# Patient Record
Sex: Female | Born: 1970
Health system: Southern US, Community
[De-identification: ages and names within clinical notes are randomized; demographics above are authoritative.]

---

## 2001-05-18 ENCOUNTER — Other Ambulatory Visit: Admission: RE | Admit: 2001-05-18 | Discharge: 2001-05-18 | Payer: Self-pay | Admitting: Obstetrics and Gynecology

## 2002-06-05 ENCOUNTER — Other Ambulatory Visit: Admission: RE | Admit: 2002-06-05 | Discharge: 2002-06-05 | Payer: Self-pay | Admitting: Obstetrics and Gynecology

## 2002-10-23 ENCOUNTER — Ambulatory Visit (HOSPITAL_COMMUNITY): Admission: RE | Admit: 2002-10-23 | Discharge: 2002-10-23 | Payer: Self-pay | Admitting: Gastroenterology

## 2003-01-14 ENCOUNTER — Encounter: Payer: Self-pay | Admitting: Obstetrics and Gynecology

## 2003-01-14 ENCOUNTER — Ambulatory Visit (HOSPITAL_COMMUNITY): Admission: RE | Admit: 2003-01-14 | Discharge: 2003-01-14 | Payer: Self-pay | Admitting: Obstetrics and Gynecology

## 2003-05-14 ENCOUNTER — Other Ambulatory Visit: Admission: RE | Admit: 2003-05-14 | Discharge: 2003-05-14 | Payer: Self-pay | Admitting: Obstetrics and Gynecology

## 2003-11-30 ENCOUNTER — Inpatient Hospital Stay (HOSPITAL_COMMUNITY): Admission: RE | Admit: 2003-11-30 | Discharge: 2003-11-30 | Payer: Self-pay | Admitting: Obstetrics and Gynecology

## 2004-08-17 ENCOUNTER — Other Ambulatory Visit: Admission: RE | Admit: 2004-08-17 | Discharge: 2004-08-17 | Payer: Self-pay | Admitting: Obstetrics and Gynecology

## 2004-08-18 ENCOUNTER — Ambulatory Visit (HOSPITAL_COMMUNITY): Admission: RE | Admit: 2004-08-18 | Discharge: 2004-08-18 | Payer: Self-pay | Admitting: Obstetrics and Gynecology

## 2004-09-07 ENCOUNTER — Ambulatory Visit (HOSPITAL_COMMUNITY): Admission: RE | Admit: 2004-09-07 | Discharge: 2004-09-07 | Payer: Self-pay | Admitting: Obstetrics and Gynecology

## 2005-02-02 ENCOUNTER — Ambulatory Visit (HOSPITAL_COMMUNITY): Admission: RE | Admit: 2005-02-02 | Discharge: 2005-02-02 | Payer: Self-pay | Admitting: Obstetrics and Gynecology

## 2005-09-18 ENCOUNTER — Inpatient Hospital Stay (HOSPITAL_COMMUNITY): Admission: AD | Admit: 2005-09-18 | Discharge: 2005-09-21 | Payer: Self-pay | Admitting: Obstetrics and Gynecology

## 2005-10-16 IMAGING — US US PELVIS COMPLETE MODIFY
1 series · 13 of 25 positions shown · non-contrast
Comparison: none

CLINICAL DATA: Endometriosis. Fibroids.

[Series 1: us pelvis complete modify · 0.36mm/px · 13 of 59 slices shown]
[im 1/59]
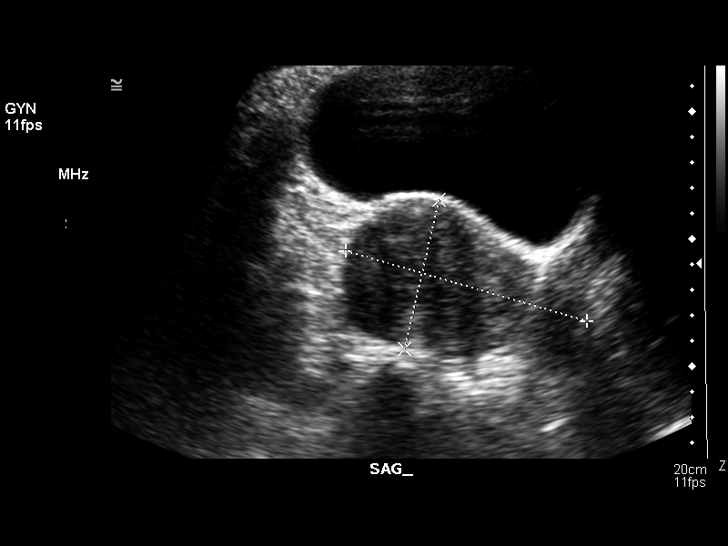
[im 5/59]
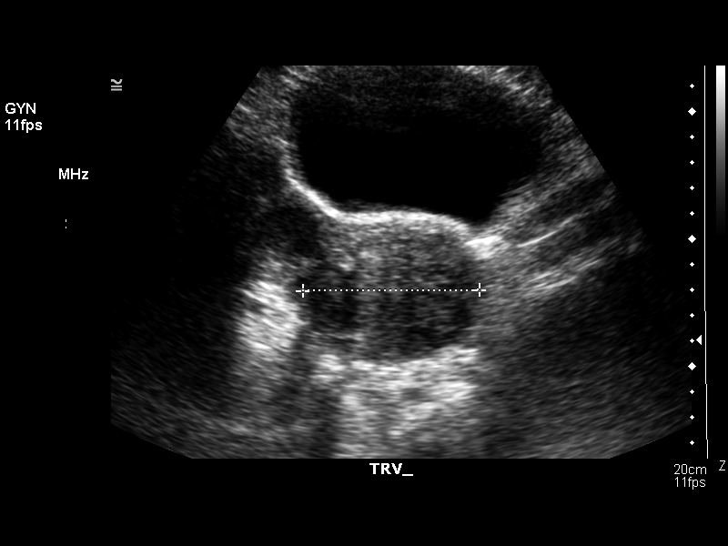
[im 10/59]
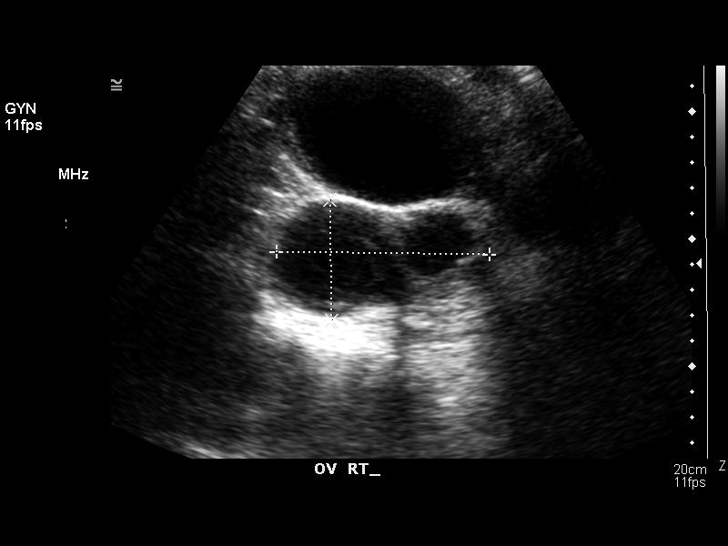
[im 15/59]
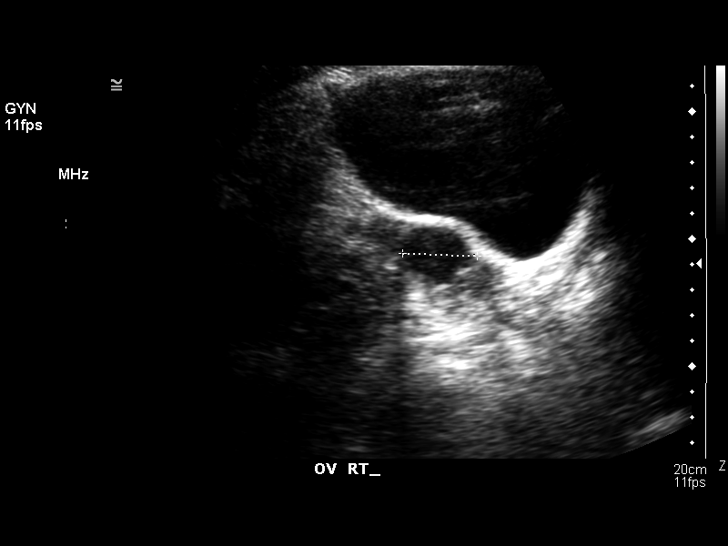
[im 20/59]
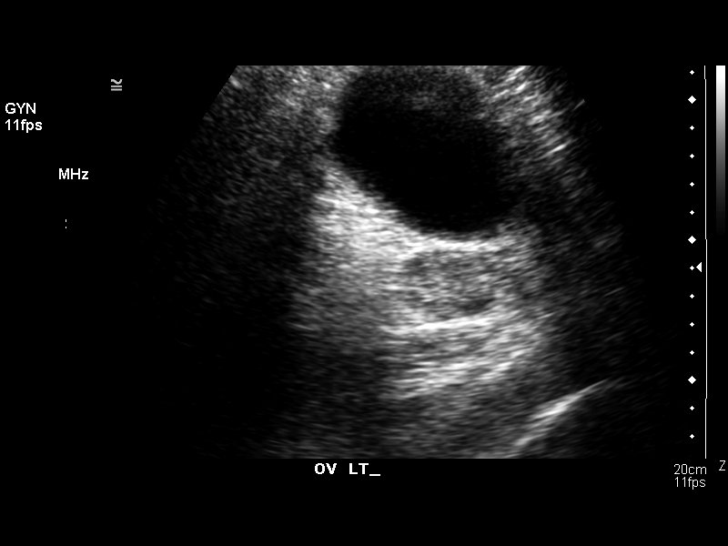
[im 25/59]
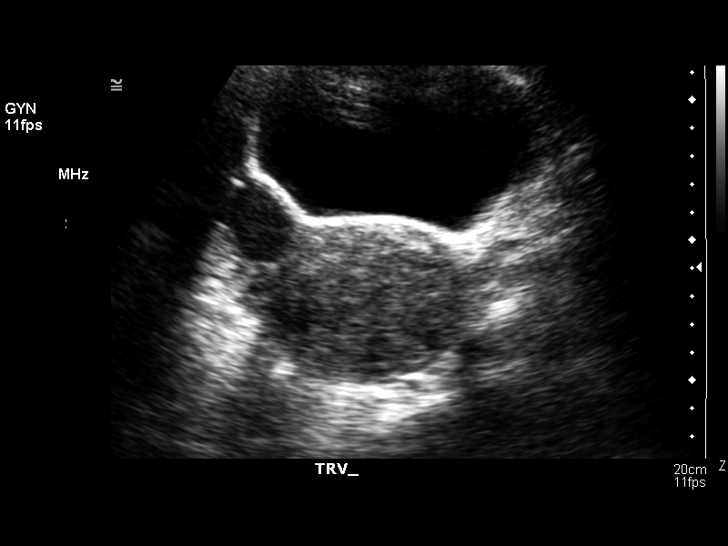
[im 30/59]
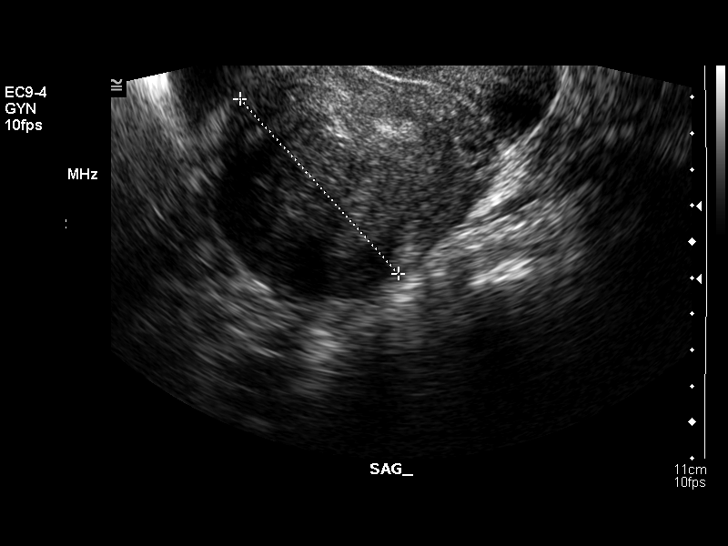
[im 34/59]
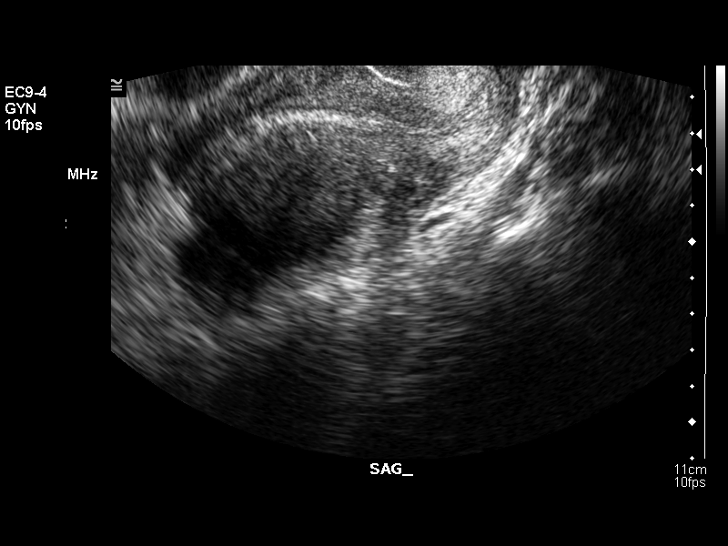
[im 39/59]
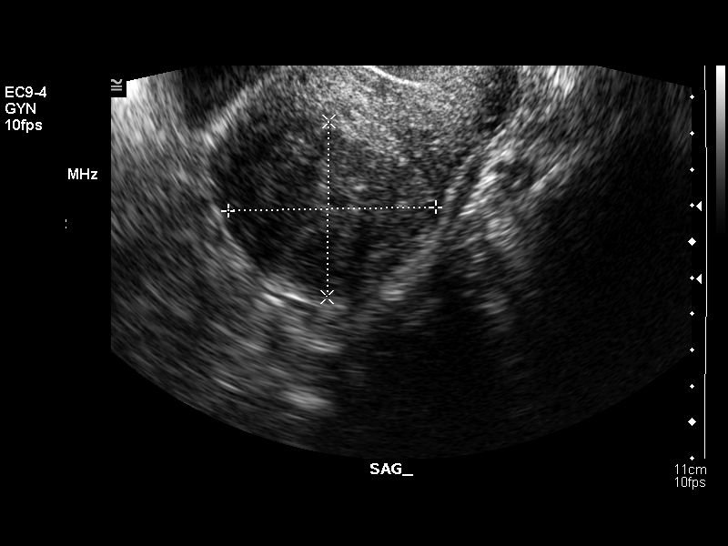
[im 44/59]
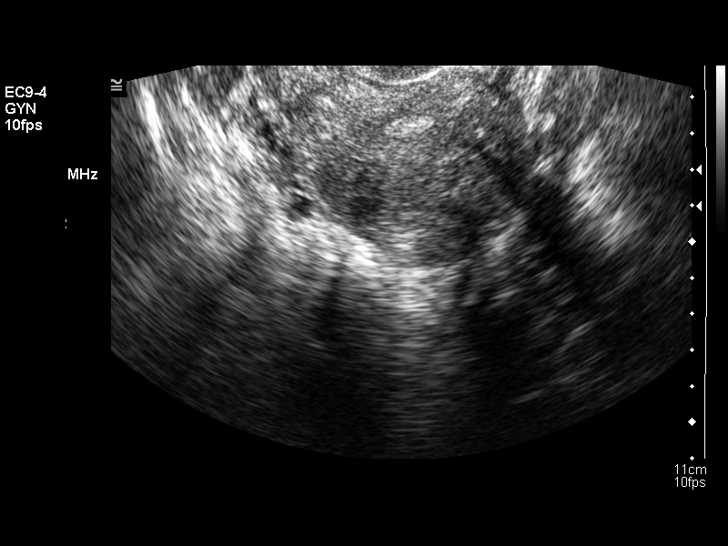
[im 49/59]
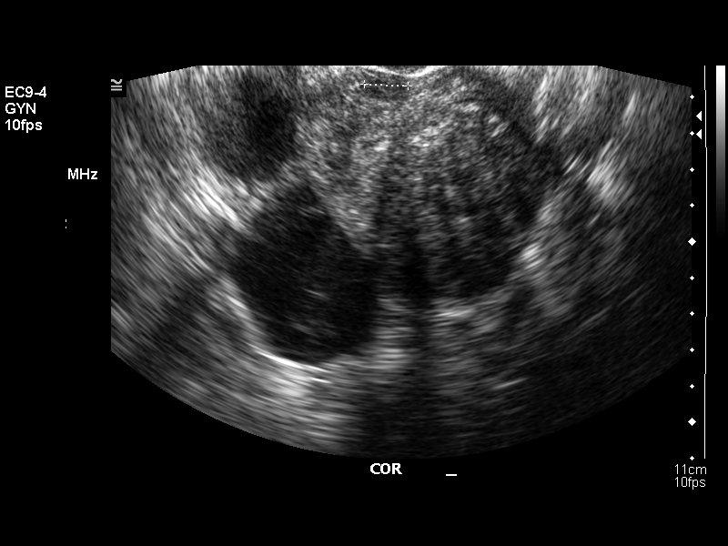
[im 54/59]
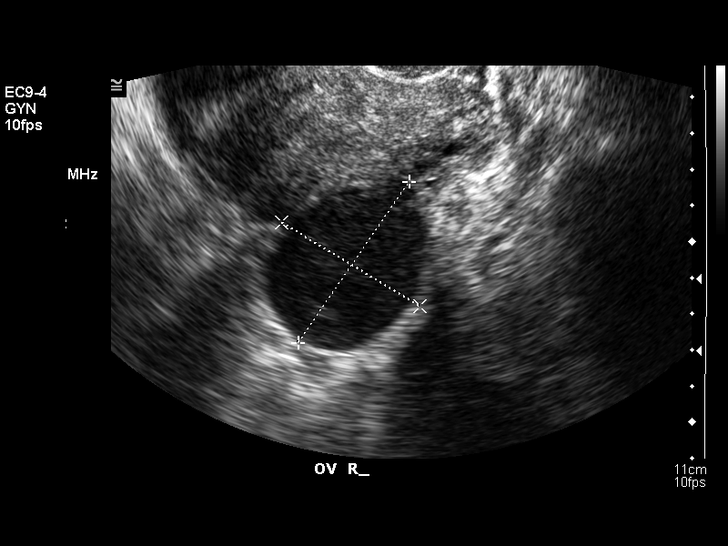
[im 59/59]
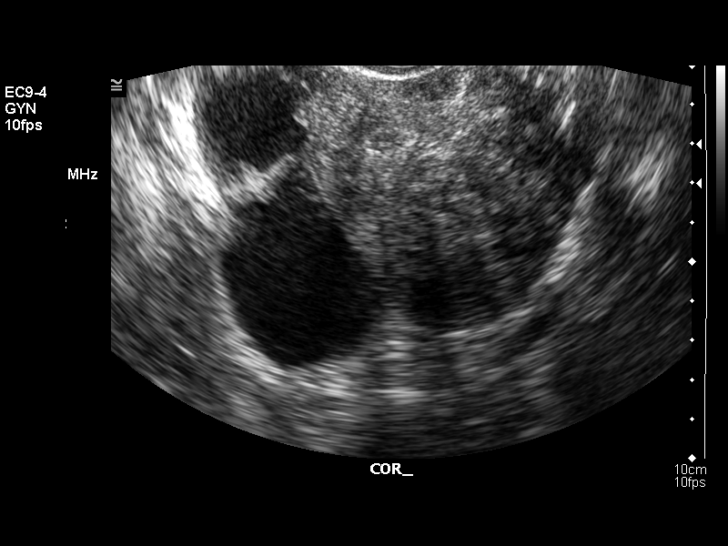

[13 of 25 positions shown; findings below may reference images not displayed]

TRANSABDOMINAL AND TRANSVAGINAL PELVIC ULTRASOUND:

The uterus is enlarged, measuring approximately 10.7 x 6.5 x 6.4 cm on
transabdominal sonography. At least five discrete fibroids are seen, largest of
which is located in the right posterior uterine body and measures 6.1 x 4.2 x
5.8 cm. This has a partial submucosal component which displaces the endometrial
cavity anteriorly. The other fibroids range in size from 1 to 2 cm. The
maximum double-layer endometrial thickness obtained on transvaginal sonography
is 8 to 9 mm. The endometrial stripe is normal in appearance.

No normal appearing right ovary is seen. Two large cystic lesions are seen in
the right adnexa, both of which have low-level internal echoes and increase
through transmission, which are sonographic features of endometriomas. These
measure 3.6 x 2.6 x 2.8 cm and 5.1 x 3.9 x 4.1 cm in size.

The left ovary is seen only on transabdominal sonography and is normal in
appearance. No left sided adnexal masses are seen and there is no evidence of
free fluid.
IMPRESSION: 1. Multiple uterine fibroids, with largest fibroid in the left posterior
uterine body measuring approximately 6 cm in diameter. This largest fibroid has
a partial submucosal component displacing the endometrium anteriorly. 

2. Two cystic lesions within the right adnexa, largest measuring 5 cm, which
have sonographic features consistent with endometriomas. If these are not
removed surgically, follow-up ultrasound is recommended to confirm stability
over time. 

3. Normal appearance of the left ovary.

## 2005-11-01 ENCOUNTER — Other Ambulatory Visit: Admission: RE | Admit: 2005-11-01 | Discharge: 2005-11-01 | Payer: Self-pay | Admitting: Obstetrics and Gynecology

## 2005-11-05 IMAGING — RF DG HYSTEROGRAM
10 series · 10 of 10 positions shown · IV contrast (omnipaque)
Comparison: 01/14/03.

CLINICAL DATA: Infertility.  Endometriosis.  Fibroids.
 HYSTEROSALPINGOGRAM:
 Following cleansing of the cervix and vagina with Betadine solution, a hysterosalpingogram was performed using a 5 French hysterosalpingogram catheter and Omnipaque 300 contrast.  The patient tolerated the examination without difficultly.

[Series 1: run · 1 of 1 slices shown (1 of 10)]
[im 1/1]
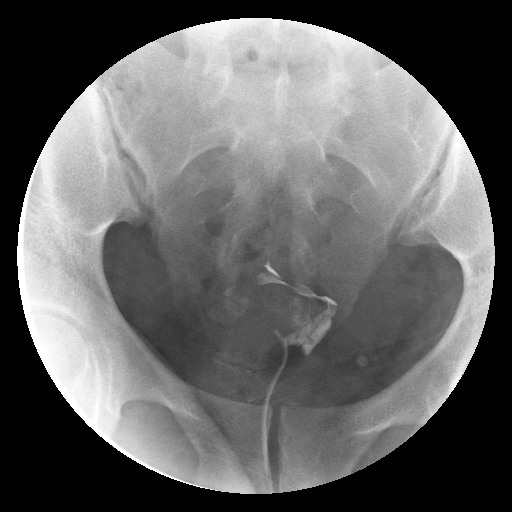

[Series 2: run · 1 of 1 slices shown (2 of 10)]
[im 1/1]
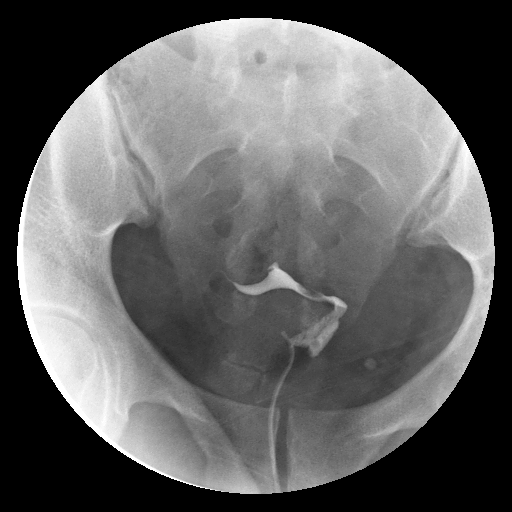

[Series 3: run · 1 of 1 slices shown (3 of 10)]
[im 1/1]
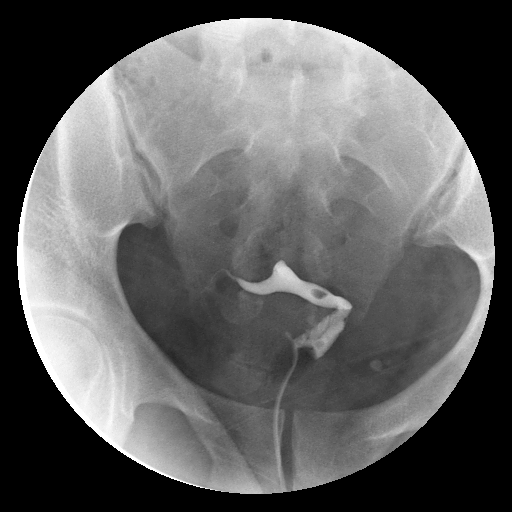

[Series 4: run · 1 of 1 slices shown (4 of 10)]
[im 1/1]
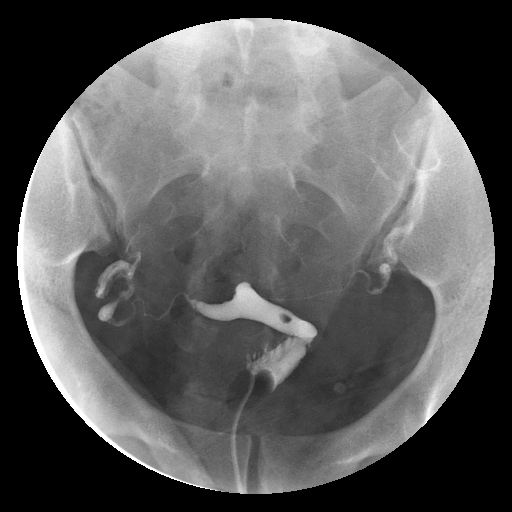

[Series 5: run · 1 of 1 slices shown (5 of 10)]
[im 1/1]
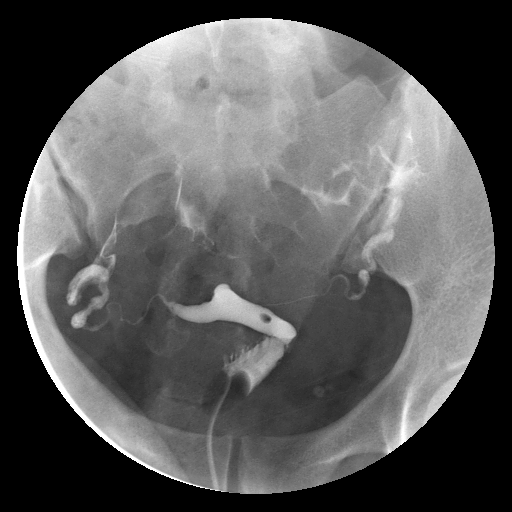

[Series 6: run · 1 of 1 slices shown (6 of 10)]
[im 1/1]
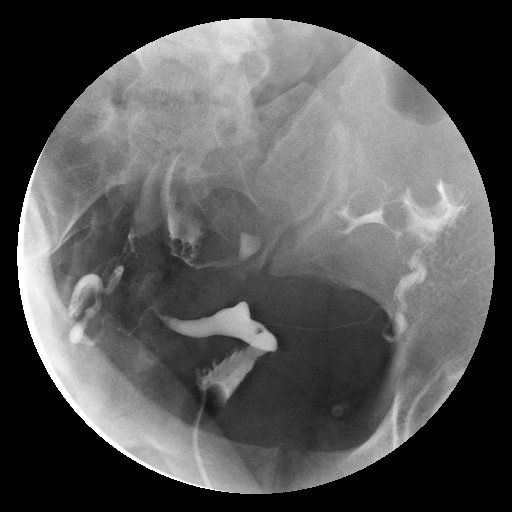

[Series 7: run · 1 of 1 slices shown (7 of 10)]
[im 1/1]
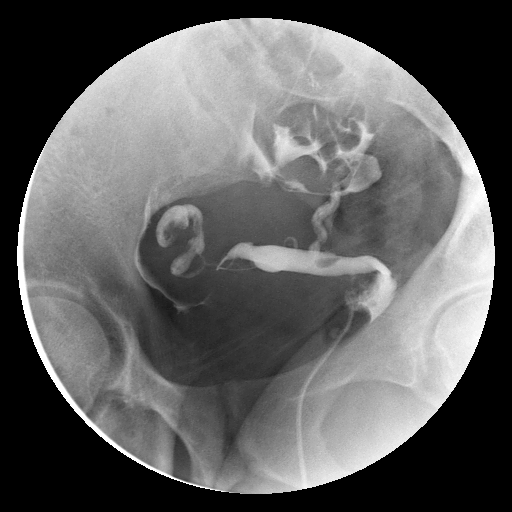

[Series 8: run · 1 of 1 slices shown (8 of 10)]
[im 1/1]
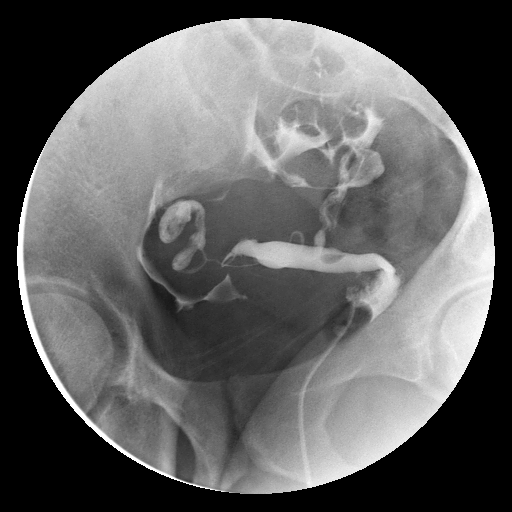

[Series 9: run · 1 of 1 slices shown (9 of 10)]
[im 1/1]
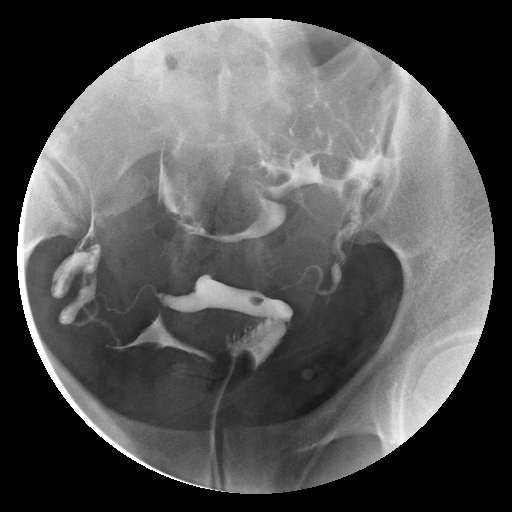

[Series 10: run · 1 of 1 slices shown (10 of 10)]
[im 1/1]
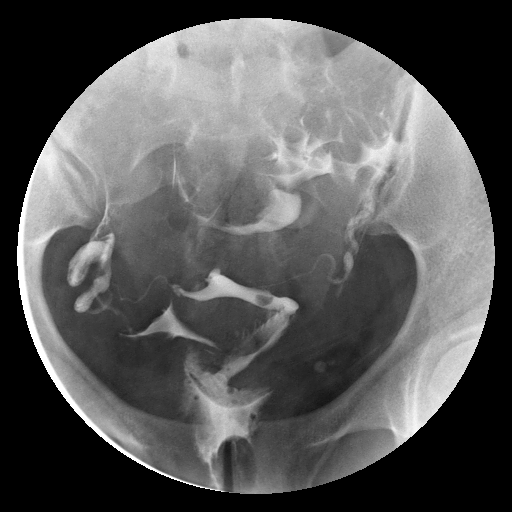

[10 of 10 positions shown; findings below may reference images not displayed]

The uterus remains anteflexed with the endometrial cavity tilted to the right.  This is likely due to the dominant left sided fibroid seen on previous pelvic ultrasound.  No definite submucosal fibroid is seen.  There is a persistent polypoid filling defect in the lower uterine body which is unchanged compared with the prior exam and is suspicious for a small endometrial polyp.  
 Opacification of both fallopian tubes is seen.  Both fallopian tubes are normal in appearance and intraperitoneal spill of contrast from both tubes is demonstrated.
IMPRESSION: Endometrial cavity of the uterus is tilted to the right, likely due to the dominant left sided fibroid seen on prior pelvic ultrasound.  No submucosal fibroids identified.  Small filling defect in the lower portion of the endometrial cavity is unchanged and consistent with an endometrial polyp.  
 Both fallopian tubes are patent.

## 2006-04-02 IMAGING — US US OB TRANSVAGINAL MODIFY
1 series · 17 of 28 positions shown · non-contrast
Comparison: none

CLINICAL DATA: 7 week 0 day gestational age by LMP. Known fibroids and endometriosis. Evaluate fetal life and confirm dating.
OBSTETRICAL ULTRASOUND WITH TRANSVAGINAL:
Comparison is made with prior pelvic ultrasound performed on 08/18/04.
A single living intrauterine gestation is seen with measured heart rate of 120.  Embryonic crown rump length measures 9 mm, corresponding with a gestational age of 7 weeks 0 days.  A normal appearing yolk sac is seen.  There is no evidence of subchorionic hemorrhage.
At least four separate uterine fibroids are seen.  These range in size from 2 cm to the largest in the posterior uterine fundus measuring 8.2 x 6.2 x 8.9 cm.  This largest fibroid is mildly increased in size compared with prior study when it measured 6 cm in greatest diameter.  Again seen are two cystic lesions in the right adnexa which have low level internal echoes and increase through transmission and are located adjacent to the right ovary.  The largest measures approximately 4.5 cm in diameter.  These are unchanged in size and appearance, and sonographic features are consistent with endometriomas.  
The left ovary is not directly visualized on transabdominal or transvaginal sonography, however no left adnexal masses are seen.

[Series 1: us ob comp less 14 wks · 17 of 51 slices shown]
[im 1/51]
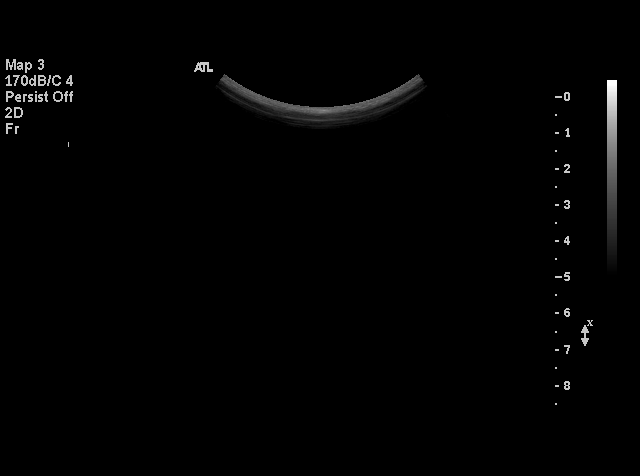
[im 4/51]
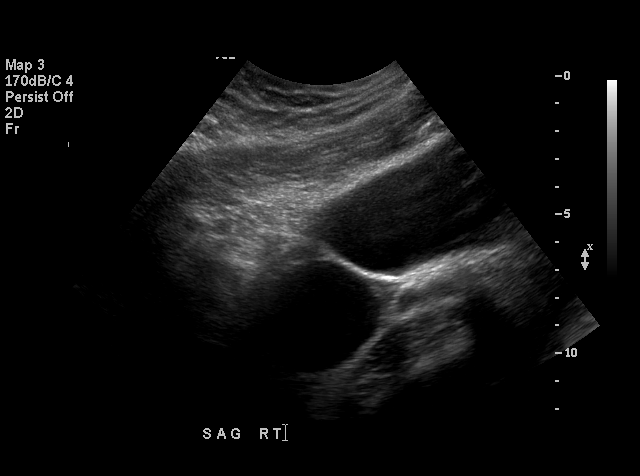
[im 8/51]
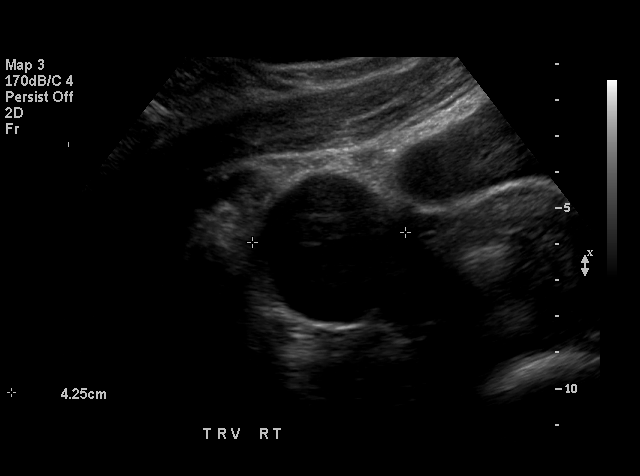
[im 10/51]
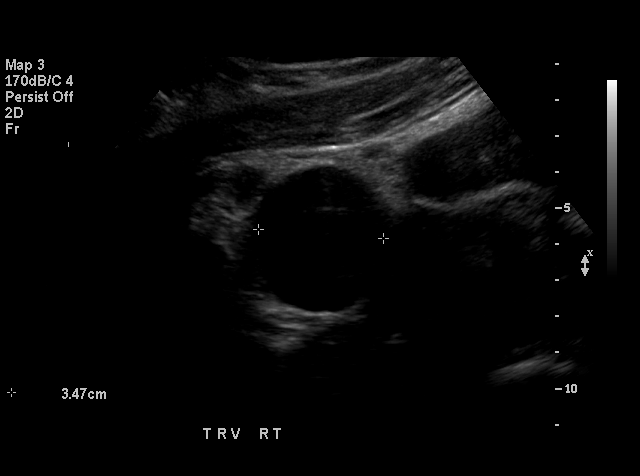
[im 13/51]
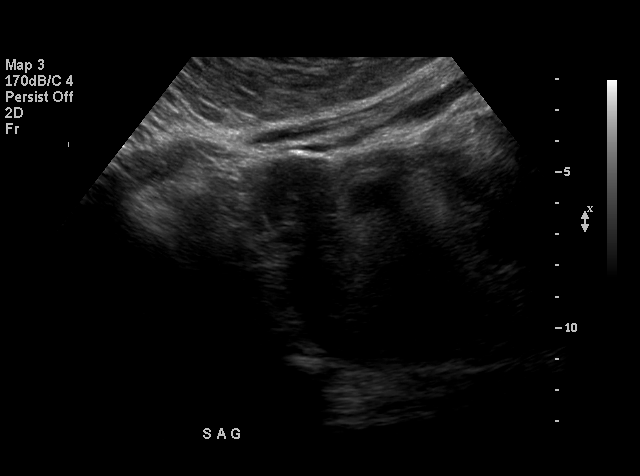
[im 17/51]
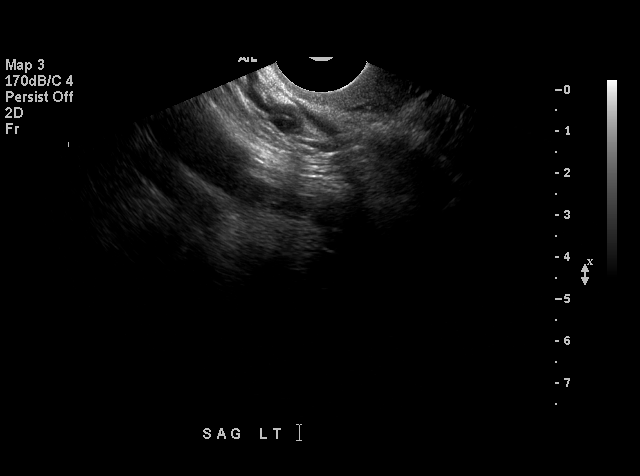
[im 19/51]
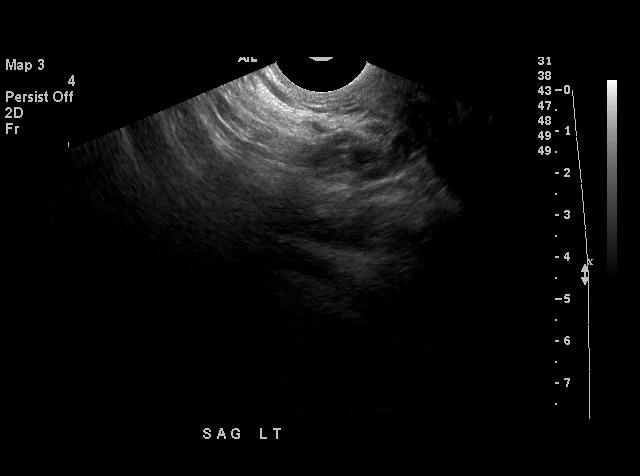
[im 23/51]
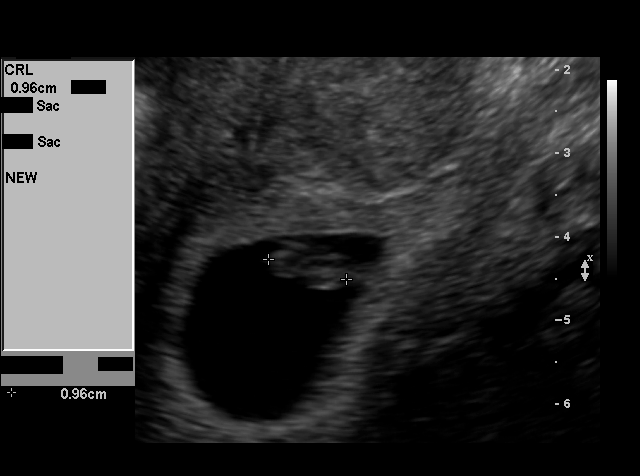
[im 26/51]
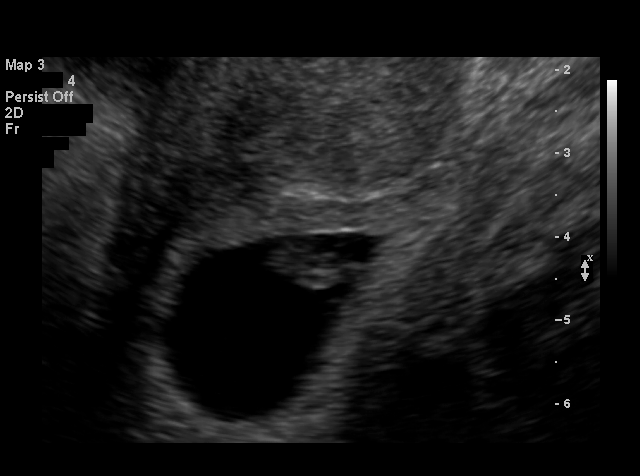
[im 28/51]
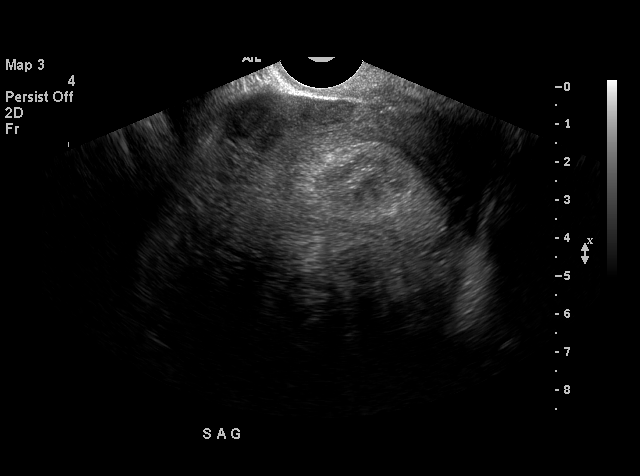
[im 32/51]
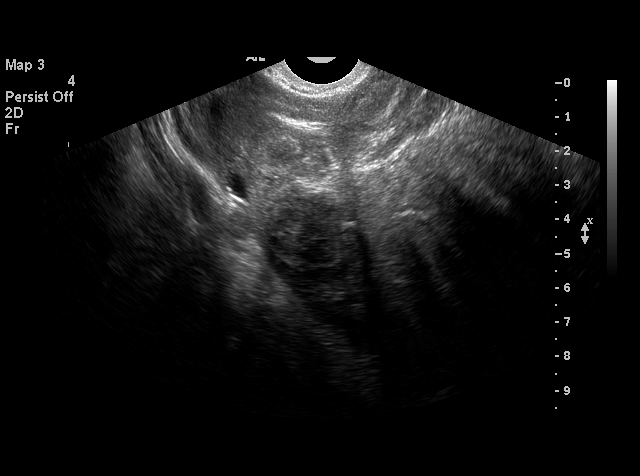
[im 34/51]
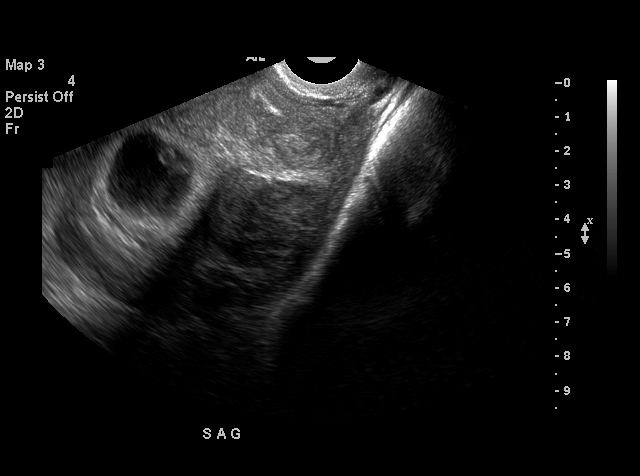
[im 38/51]
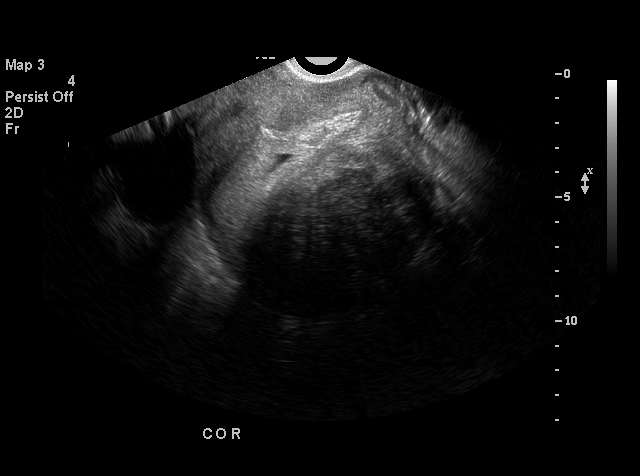
[im 41/51]
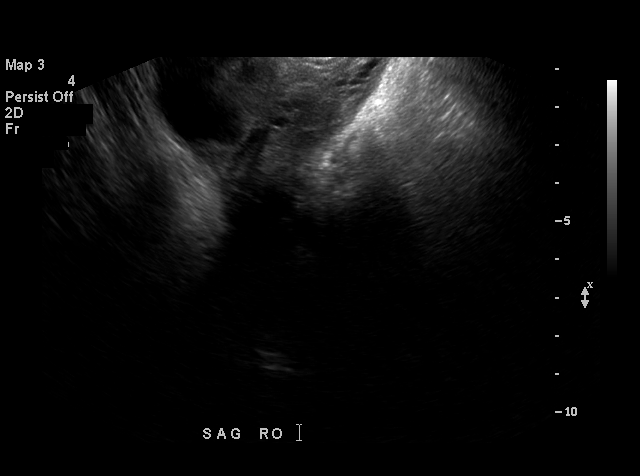
[im 43/51]
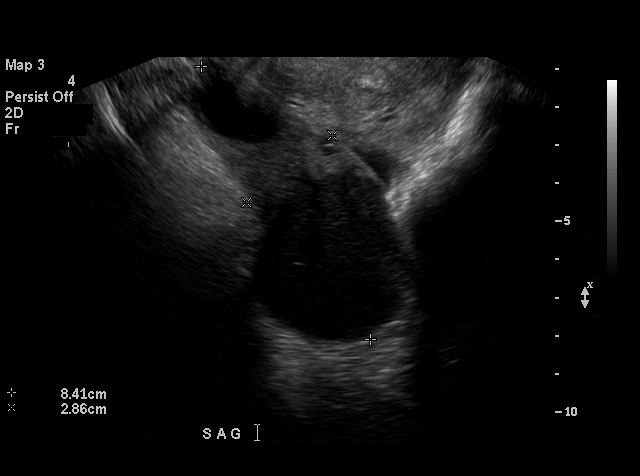
[im 47/51]
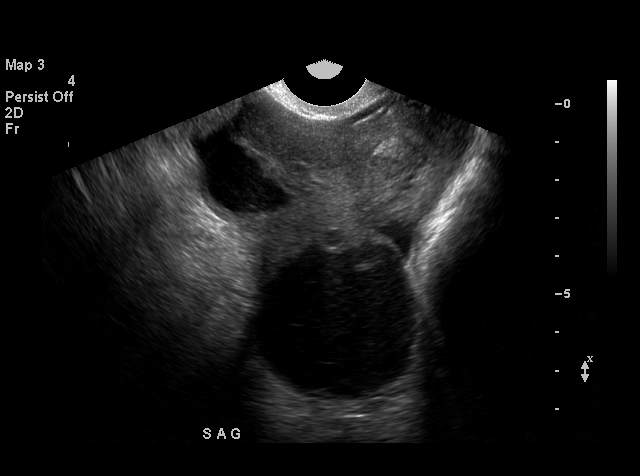
[im 51/51]
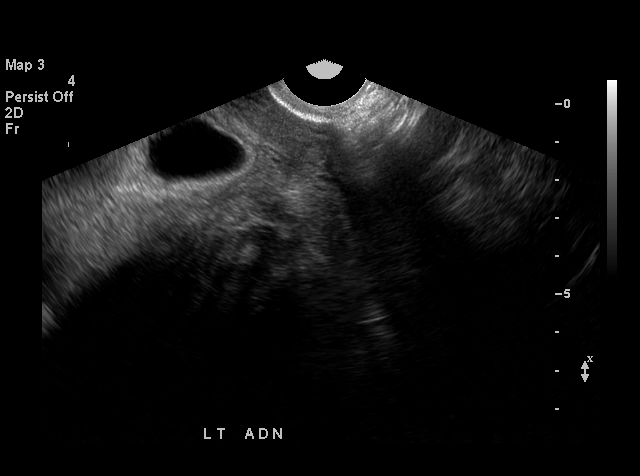

[17 of 28 positions shown; findings below may reference images not displayed]

IMPRESSION: 1.  Single living intrauterine gestation with estimated gestational age of 7 weeks 0 days and sonographic EDC of 09/21/05.  This matches LMP dating.  
2.  At least four uterine fibroids, largest measuring 8.9 cm which has increased in size from approximately 6 cm on prior study of 08/18/04.
3.  Two complex cystic lesions in the right adnexa adjacent to the right ovary, largest measuring 5 cm.  These remain stable, with sonographic features consistent with endometriomas.

## 2010-11-01 ENCOUNTER — Encounter: Payer: Self-pay | Admitting: Obstetrics and Gynecology

## 2014-08-21 ENCOUNTER — Other Ambulatory Visit: Payer: Self-pay | Admitting: Obstetrics and Gynecology

## 2014-08-21 DIAGNOSIS — Z1231 Encounter for screening mammogram for malignant neoplasm of breast: Secondary | ICD-10-CM

## 2014-09-19 ENCOUNTER — Ambulatory Visit
Admission: RE | Admit: 2014-09-19 | Discharge: 2014-09-19 | Disposition: A | Discharge status: Home / Self Care | Payer: BC Managed Care – PPO | Source: Ambulatory Visit | Attending: Obstetrics and Gynecology | Admitting: Obstetrics and Gynecology

## 2014-09-19 DIAGNOSIS — Z1231 Encounter for screening mammogram for malignant neoplasm of breast: Secondary | ICD-10-CM

## 2018-08-25 DIAGNOSIS — R194 Change in bowel habit: Secondary | ICD-10-CM | POA: Diagnosis not present

## 2018-08-25 DIAGNOSIS — R208 Other disturbances of skin sensation: Secondary | ICD-10-CM | POA: Diagnosis not present

## 2018-08-25 DIAGNOSIS — G4733 Obstructive sleep apnea (adult) (pediatric): Secondary | ICD-10-CM | POA: Diagnosis not present

## 2018-09-14 ENCOUNTER — Other Ambulatory Visit: Payer: Self-pay | Admitting: Gastroenterology

## 2018-09-14 DIAGNOSIS — K6289 Other specified diseases of anus and rectum: Secondary | ICD-10-CM | POA: Diagnosis not present

## 2018-09-14 DIAGNOSIS — R194 Change in bowel habit: Secondary | ICD-10-CM | POA: Diagnosis not present

## 2018-10-16 NOTE — H&P (Signed)
History of Present Illness  General:  47/female presents with change in bowel habits. Recent labs from 08/25/18 were normal for CBC, CMP showed Cr of 1.08, otherwise normal. Since 3/19, she has change in bowel habits, prior to that she needed to drink a lot of water to avoid constipation.After having her son, she had BMs she may not have a Bm for days but did not need a laxative. For the last 10 months, she has developed change in Bms and needs to use the bathroom after almost every meal, stools were soft to loose, non bloody, around1-3/day. Since 9/19, she developed pain in the rectal area, she felt a pulsation in rectal area, she thought it was getting warm, felt like a rubber and folding on itself when she would sit. She had rectal pain on sitting down and during a Bm and thought her rectum felt "hot'. Denies nocturnal diarrhea, unintentional weight loss, denies blood in stool or black stools, denies abdominal pain/nausea/vomiting/acid reflux/difficulty or pain on swallowing. There is no family history of colon cancer and no prior colonoscopy. She had a DRE by Kateri Mc and it was reported normal and small internal hemorrhoids. She has been using prep H suppository and uses a round donut while sitting.   Current Medications  None   Past Medical History  GYN exam [facility] 11/15 .   Pelvic Pain.   Irregular periods.   Anemia.   Vitamin D def.   Right ovarian cyst.    Surgical History  Appendectomy   C-section 2006  right ovarian cyst-laparoscopy 02/2001  Wisdom Teeth extraction    Family History  Father: deceased  Mother: alive, diagnosed with Diabetes  No Family History of Colon Cancer, Polyps, or Liver Disease.   Social History  General:  no Alcohol.  Caffeine: yes.  DIET: no.  Tobacco use  cigarettes: Never smoked Tobacco history last updated 09/14/2018 no Recreational drug use.  OCCUPATION: programmer.  no Exercise.    Allergies  anasthesia :  nausea/vomiing   Hospitalization/Major Diagnostic Procedure  not in past year 09/2018   Review of Systems  CONSTITUTIONAL:  Chills No. Fatigue No. Fever No. Insomnia No. Night sweats No. Weight change No.  HEENT:  Change in vision No. Double vision No . Hoarseness No. Nose bleeds No. sore throat No. Sinus Problems No. Glaucoma No.  CARDIOLOGY:  ByPass Surgery No. Poor Circulation No. Artificial Heart Valves No. High blood pressure No. History of Heart attack No. Irregular heart beat No. Known coronary artery disease No. Pacemaker/Defibrillator No.  RESPIRATORY:  Shortness of breath No. Cough No. Excessive Sputum No. Using Oxygen No. Asthma No. Bronchitis No. Pneumonia No. Sleep Apnea YES.  UROLOGY:  Interstitials Cystitis No. Incontinence No. Blood in urine No. Difficulty urinating No. Kidney disease No. Kidney stones No.  GASTROENTEROLOGY:  Abdominal pain No. Acid reflux No. Black stools No. Bloating/belching No. Change in bowel habits YES. Constipation No. Dark tarry stools No. Diarrhea No. Difficulty swallowing No. Heartburn No. Incontinence of stool No. Indigestion: No. Lactose intolerance No. Nausea No. Rectal bleeding No. Vomiting No. Hepatitis/yellow jaundice No. History of Ulcers No. Use of Pain Medication No. Previous Colonoscopy No.  MUSCULOSKELETAL:  joint pain No. Arthritis No. Joint Replacement No.  NEUROLOGY:  Dizziness No. Fainting No. Headache No. Paralysis No. Seizures No. Strokes No. Weakness No. Alzheimer's No.  SKIN:  Rash No. Bruises easily No.  ENDOCRINOLOGY:  Diabetes No. High cholesterol No. Thyroid disorder No.  HEMATOLOGY/LYMPH:  Abnormal bleeding No. Anemia No. Enlarged lymph  nodes No. Past blood transfusion No. Swollen glands No. Blood Clots No. Using Blood Thinners No.  INFECTIOUS DISEASE:  HIV/AIDS No. Tuberculosis No . Hepatitis No. Sexually Transmitted Disease No. MRSA No.  GI PROCEDURE:  no Pacemaker/ AICD, no. no Artificial heart valves. no MI/heart  attack. no Abnormal heart rhythm. no Angina. no CVA. no Hypertension. no Hypotension. no Asthma, COPD. no Sleep apnea. no Seizure disorders. no Artificial joints. no Severe DJD. no Diabetes. no Significant headaches. Vertigo YES. no Depression/anxiety. no Abnormal bleeding. no Kidney Disease. no Liver disease, no. no Chance of pregnancy. no Blood transfusion. no Method of Birth Control. no Birth control pills.  GU/GYN:  Pregnant Now No. Trying to conceive No. Use Birth Control No. Heavy Periods No.      Vital Signs  Wt 327, Wt change 1 lb, Ht 65, BMI 54.41, Temp 98.4, Pulse sitting 83, BP sitting 129/76.   Examination  Gastroenterology:: GENERAL APPEARANCE: Well developed, obese, no active distress, pleasant.  EYES: Lids and conjunctiva normal. Sclera normal.  ORAL CAVITY: Lips, teeth and gums are normal. Pharynx, tongue, mucosa normal .  SCLERA: anicteric .  CARDIOVASCULAR RRR no murmur, Normal RRR w/o murmers or gallops. No peripheral edema .  RESPIRATORY Breath sounds normal. Respiration even and unlabored .  ABDOMEN No masses palpated. Liver and spleen not palpated, normal. Bowel sounds normal, Abdomen not distended .  EXTREMITIES: No edema, pulses intact .  NEURO: normal strength, normal gait .  PSYCH: mood/affect normal .     Assessments   1. Change in bowel habits - R19.4 (Primary)   2. Rectal pain - K62.89   Treatment  1. Change in bowel habits  IMAGING: Colonoscopy Notes: DDX: microscopic colitis, IBS, diverticulosis. The risks and benefits were discussed with the patient in details. She understands and verbalizes consent. Will obtain biopsies from random areas of the colon.  Referral To: Reason:colon-propofol-spoke with [facility]-#[id number]    2. Rectal pain  IMAGING: Colonoscopy Notes: DRE by PCP was unremarkable. Advised to avoid straining, sitz bath at home, continue prep H suppositories.

## 2018-10-17 ENCOUNTER — Ambulatory Visit (HOSPITAL_COMMUNITY): Payer: 59 | Admitting: Anesthesiology

## 2018-10-17 ENCOUNTER — Ambulatory Visit (HOSPITAL_COMMUNITY)
Admission: RE | Admit: 2018-10-17 | Discharge: 2018-10-17 | Disposition: A | Discharge status: Home / Self Care | Payer: 59 | Attending: Gastroenterology | Admitting: Gastroenterology

## 2018-10-17 ENCOUNTER — Encounter (HOSPITAL_COMMUNITY)
Admission: RE | Disposition: A | Discharge status: Home / Self Care | Payer: Self-pay | Source: Home / Self Care | Attending: Gastroenterology

## 2018-10-17 ENCOUNTER — Encounter (HOSPITAL_COMMUNITY): Payer: Self-pay | Admitting: Anesthesiology

## 2018-10-17 ENCOUNTER — Other Ambulatory Visit: Payer: Self-pay

## 2018-10-17 DIAGNOSIS — K6289 Other specified diseases of anus and rectum: Secondary | ICD-10-CM | POA: Insufficient documentation

## 2018-10-17 DIAGNOSIS — Z6841 Body Mass Index (BMI) 40.0 and over, adult: Secondary | ICD-10-CM | POA: Insufficient documentation

## 2018-10-17 DIAGNOSIS — G4730 Sleep apnea, unspecified: Secondary | ICD-10-CM | POA: Insufficient documentation

## 2018-10-17 DIAGNOSIS — D122 Benign neoplasm of ascending colon: Secondary | ICD-10-CM | POA: Insufficient documentation

## 2018-10-17 DIAGNOSIS — R194 Change in bowel habit: Secondary | ICD-10-CM | POA: Diagnosis not present

## 2018-10-17 HISTORY — PX: BIOPSY: SHX5522

## 2018-10-17 HISTORY — PX: COLONOSCOPY: SHX5424

## 2018-10-17 LAB — PREGNANCY, URINE: Preg Test, Ur: NEGATIVE

## 2018-10-17 SURGERY — COLONOSCOPY
Anesthesia: Monitor Anesthesia Care

## 2018-10-17 MED ORDER — ONDANSETRON HCL 4 MG/2ML IJ SOLN
INTRAMUSCULAR | Status: DC | PRN
  Administered 2018-10-17: 4 mg via INTRAVENOUS

## 2018-10-17 MED ORDER — SODIUM CHLORIDE 0.9 % IV SOLN: INTRAVENOUS | Status: DC

## 2018-10-17 MED ORDER — LACTATED RINGERS IV SOLN
INTRAVENOUS | Status: DC
  Administered 2018-10-17: 1000 mL via INTRAVENOUS

## 2018-10-17 MED ORDER — PHENYLEPHRINE HCL 10 MG/ML IJ SOLN
INTRAMUSCULAR | Status: DC | PRN
  Administered 2018-10-17: 80 ug via INTRAVENOUS

## 2018-10-17 MED ORDER — MIDAZOLAM HCL 5 MG/5ML IJ SOLN
INTRAMUSCULAR | Status: DC | PRN
  Administered 2018-10-17: 2 mg via INTRAVENOUS

## 2018-10-17 MED ORDER — PROPOFOL 500 MG/50ML IV EMUL
INTRAVENOUS | Status: DC | PRN
  Administered 2018-10-17: 125 ug/kg/min via INTRAVENOUS

## 2018-10-17 MED ORDER — PROPOFOL 10 MG/ML IV BOLUS
INTRAVENOUS | Status: AC
  Filled 2018-10-17: qty 40

## 2018-10-17 MED ORDER — SCOPOLAMINE 1 MG/3DAYS TD PT72
MEDICATED_PATCH | TRANSDERMAL | Status: DC | PRN
  Administered 2018-10-17: 1 via TRANSDERMAL

## 2018-10-17 MED ORDER — PROPOFOL 500 MG/50ML IV EMUL
INTRAVENOUS | Status: DC | PRN
  Administered 2018-10-17: 50 mg via INTRAVENOUS

## 2018-10-17 MED ORDER — SCOPOLAMINE 1 MG/3DAYS TD PT72: 1.0000 | MEDICATED_PATCH | Freq: Once | TRANSDERMAL | Status: DC

## 2018-10-17 MED ORDER — MIDAZOLAM HCL 2 MG/2ML IJ SOLN
INTRAMUSCULAR | Status: AC
  Filled 2018-10-17: qty 2

## 2018-10-17 MED ORDER — SCOPOLAMINE 1 MG/3DAYS TD PT72
MEDICATED_PATCH | TRANSDERMAL | Status: AC
  Filled 2018-10-17: qty 1

## 2018-10-17 MED ORDER — PROPOFOL 10 MG/ML IV BOLUS
INTRAVENOUS | Status: AC
  Filled 2018-10-17: qty 60

## 2018-10-17 NOTE — Transfer of Care (Signed)
Immediate Anesthesia Transfer of Care Note  Patient: Abigail Roberts  Procedure(s) Performed: Procedure(s): COLONOSCOPY (N/A) BIOPSY  Patient Location: PACU  Anesthesia Type:MAC  Level of Consciousness:  sedated, patient cooperative and responds to stimulation  Airway & Oxygen Therapy:Patient Spontanous Breathing and Patient connected to face mask oxgen  Post-op Assessment:  Report given to PACU RN and Post -op Vital signs reviewed and stable  Post vital signs:  Reviewed and stable  Last Vitals:  Vitals:   10/17/18 0758  BP: (!) 152/73  Pulse: 91  Resp: 13  Temp: 01.1 C    Complications: No apparent anesthesia complications

## 2018-10-17 NOTE — Discharge Instructions (Signed)
Colonoscopy, Adult, Care After °This sheet gives you information about how to care for yourself after your procedure. Your doctor may also give you more specific instructions. If you have problems or questions, call your doctor. °What can I expect after the procedure? °After the procedure, it is common to have: °· A small amount of blood in your poop for 24 hours. °· Some gas. °· Mild cramping or bloating in your belly. °Follow these instructions at home: °General instructions °· For the first 24 hours after the procedure: °? Do not drive or use machinery. °? Do not sign important documents. °? Do not drink alcohol. °? Do your daily activities more slowly than normal. °? Eat foods that are soft and easy to digest. °· Take over-the-counter or prescription medicines only as told by your doctor. °To help cramping and bloating: ° °· Try walking around. °· Put heat on your belly (abdomen) as told by your doctor. Use a heat source that your doctor recommends, such as a moist heat pack or a heating pad. °? Put a towel between your skin and the heat source. °? Leave the heat on for 20-30 minutes. °? Remove the heat if your skin turns bright red. This is especially important if you cannot feel pain, heat, or cold. You can get burned. °Eating and drinking ° °· Drink enough fluid to keep your pee (urine) clear or pale yellow. °· Return to your normal diet as told by your doctor. Avoid heavy or fried foods that are hard to digest. °· Avoid drinking alcohol for as long as told by your doctor. °Contact a doctor if: °· You have blood in your poop (stool) 2-3 days after the procedure. °Get help right away if: °· You have more than a small amount of blood in your poop. °· You see large clumps of tissue (blood clots) in your poop. °· Your belly is swollen. °· You feel sick to your stomach (nauseous). °· You throw up (vomit). °· You have a fever. °· You have belly pain that gets worse, and medicine does not help your  pain. °Summary °· After the procedure, it is common to have a small amount of blood in your poop. You may also have mild cramping and bloating in your belly. °· For the first 24 hours after the procedure, do not drive or use machinery, do not sign important documents, and do not drink alcohol. °· Get help right away if you have a lot of blood in your poop, feel sick to your stomach, have a fever, or have more belly pain. °This information is not intended to replace advice given to you by your health care provider. Make sure you discuss any questions you have with your health care provider. °Document Released: 10/30/2010 Document Revised: 07/28/2017 Document Reviewed: 06/21/2016 °Elsevier Interactive Patient Education © 2019 [employer] ° °

## 2018-10-17 NOTE — Anesthesia Postprocedure Evaluation (Signed)
Anesthesia Post Note  Patient: Abigail Roberts  Procedure(s) Performed: COLONOSCOPY (N/A ) BIOPSY     Patient location during evaluation: Endoscopy Anesthesia Type: MAC Level of consciousness: awake and alert, patient cooperative and oriented Pain management: pain level controlled Vital Signs Assessment: post-procedure vital signs reviewed and stable Respiratory status: spontaneous breathing, nonlabored ventilation and respiratory function stable Cardiovascular status: blood pressure returned to baseline and stable Postop Assessment: no apparent nausea or vomiting Anesthetic complications: no    Last Vitals:  Vitals:   10/17/18 0930 10/17/18 0940  BP: (!) 141/29 116/69  Pulse: 88 78  Resp: 16 17  Temp:    SpO2: 100% 96%    Last Pain:  Vitals:   10/17/18 0940  TempSrc:   PainSc: 0-No pain                 Nakai Pollio,E. Leba Tibbitts

## 2018-10-17 NOTE — Anesthesia Preprocedure Evaluation (Addendum)
Anesthesia Evaluation  Patient identified by MRN, date of birth, ID band Patient awake    Reviewed: Allergy & Precautions, NPO status , Patient's Chart, lab work & pertinent test results  History of Anesthesia Complications Negative for: history of anesthetic complications  Airway Mallampati: I  TM Distance: >3 FB Neck ROM: Full    Dental  (+) Dental Advisory Given   Pulmonary sleep apnea (no mask yet) ,    breath sounds clear to auscultation       Cardiovascular negative cardio ROS   Rhythm:Regular Rate:Normal     Neuro/Psych negative neurological ROS     GI/Hepatic negative GI ROS, Neg liver ROS,   Endo/Other  Morbid obesity  Renal/GU negative Renal ROS     Musculoskeletal   Abdominal (+) + obese,   Peds  Hematology negative hematology ROS (+)   Anesthesia Other Findings   Reproductive/Obstetrics Unsure of last LMP                            Anesthesia Physical Anesthesia Plan  ASA: III  Anesthesia Plan: MAC   Post-op Pain Management:    Induction:   PONV Risk Score and Plan: 2 and Treatment may vary due to age or medical condition  Airway Management Planned: Nasal Cannula and Natural Airway  Additional Equipment:   Intra-op Plan:   Post-operative Plan:   Informed Consent: I have reviewed the patients History and Physical, chart, labs and discussed the procedure including the risks, benefits and alternatives for the proposed anesthesia with the patient or authorized representative who has indicated his/her understanding and acceptance.   Dental advisory given  Plan Discussed with: CRNA and Surgeon  Anesthesia Plan Comments: (Plan routine monitors, MAC)       Anesthesia Quick Evaluation

## 2018-10-17 NOTE — Brief Op Note (Signed)
10/17/2018  9:26 AM  PATIENT:  Abigail Roberts  48 y.o. female  PRE-OPERATIVE DIAGNOSIS:  Change in bowel habits, Rectal pain  POST-OPERATIVE DIAGNOSIS:  colon bx/colon polyp  PROCEDURE:  Procedure(s): COLONOSCOPY (N/A) BIOPSY  SURGEON:  Surgeon(s) and Role:    Ronnette Juniper, MD - Primary  PHYSICIAN ASSISTANT:   ASSISTANTS: Cleda Daub, RN, Tinnie Gens, Tech  ANESTHESIA:   MAC  EBL:  Minimal  BLOOD ADMINISTERED:none  DRAINS: none   LOCAL MEDICATIONS USED:  NONE  SPECIMEN:  Biopsy / Limited Resection  DISPOSITION OF SPECIMEN:  PATHOLOGY  COUNTS:  YES  TOURNIQUET:  * No tourniquets in log *  DICTATION: .Dragon Dictation  PLAN OF CARE: Discharge to home after PACU  PATIENT DISPOSITION:  PACU - hemodynamically stable.   Delay start of Pharmacological VTE agent (>24hrs) due to surgical blood loss or risk of bleeding: no

## 2018-10-17 NOTE — Op Note (Signed)
[facility] Patient Name: Abigail Roberts Procedure Date: 10/17/2018 MRN: [id number] Attending MD: Ronnette Juniper , MD Date of Birth: [birth year] CSN: [id number] Age: 48 Admit Type: Outpatient Procedure:                Colonoscopy Indications:              This is the patient's first colonoscopy, Change in                            bowel habits, Rectal pain Providers:                Ronnette Juniper, MD, Cleda Daub, RN, Tinnie Gens,                            Technician, Arnoldo Hooker, CRNA Referring MD:              Medicines:                Monitored Anesthesia Care Complications:            No immediate complications. Estimated blood loss:                            None. Estimated Blood Loss:     Estimated blood loss: none. Procedure:                Pre-Anesthesia Assessment:                           - Prior to the procedure, a History and Physical                            was performed, and patient medications and                            allergies were reviewed. The patient's tolerance of                            previous anesthesia was also reviewed. The risks                            and benefits of the procedure and the sedation                            options and risks were discussed with the patient.                            All questions were answered, and informed consent                            was obtained. Prior Anticoagulants: The patient has                            taken no previous anticoagulant or antiplatelet                            agents. ASA  Grade Assessment: III - A patient with                            severe systemic disease. After reviewing the risks                            and benefits, the patient was deemed in                            satisfactory condition to undergo the procedure.                           After obtaining informed consent, the colonoscope                            was passed under direct vision.  Throughout the                            procedure, the patient's blood pressure, pulse, and                            oxygen saturations were monitored continuously. The                            PCF-H190DL ([id number] Olympus peds colonoscope was                            introduced through the anus and advanced to the the                            cecum, identified by appendiceal orifice and                            ileocecal valve. The colonoscopy was performed                            without difficulty. The patient tolerated the                            procedure well. The quality of the bowel                            preparation was good. Scope In: 8:57:55 AM Scope Out: 9:21:56 AM Scope Withdrawal Time: 0 hours 11 minutes 38 seconds  Total Procedure Duration: 0 hours 24 minutes 1 second  Findings:      The perianal and digital rectal examinations were normal.      Anal papilla(e) were hypertrophied.      A 5 mm polyp was found in the ascending colon. The polyp was sessile.       The polyp was removed with a piecemeal technique using a cold biopsy       forceps. Resection and retrieval were complete.      The exam was otherwise without abnormality on direct and retroflexion       views.  Biopsies for histology were taken with a cold forceps from the random       areas of right and left colon for evaluation of microscopic colitis. Impression:               - Anal papilla(e) were hypertrophied.                           - One 5 mm polyp in the ascending colon, removed                            piecemeal using a cold biopsy forceps. Resected and                            retrieved.                           - The examination was otherwise normal on direct                            and retroflexion views.                           - Biopsies were taken with a cold forceps from the                            random areas of right and left colon for evaluation                             of microscopic colitis. Moderate Sedation:      Patient did not receive moderate sedation for this procedure, but       instead received monitored anesthesia care. Recommendation:           - Patient has a contact number available for                            emergencies. The signs and symptoms of potential                            delayed complications were discussed with the                            patient. Return to normal activities tomorrow.                            Written discharge instructions were provided to the                            patient.                           - Resume regular diet.                           - Continue present medications.                           -  Await pathology results.                           - Repeat colonoscopy for surveillance based on                            pathology results. Procedure Code(s):        --- Professional ---                           [phone], Colonoscopy, flexible; with biopsy, single                            or multiple Diagnosis Code(s):        --- Professional ---                           K62.89, Other specified diseases of anus and rectum                           D12.2, Benign neoplasm of ascending colon                           R19.4, Change in bowel habit CPT copyright 2018 [employer]. All rights reserved. The codes documented in this report are preliminary and upon coder review may  be revised to meet current compliance requirements. Ronnette Juniper, MD 10/17/2018 9:25:25 AM This report has been signed electronically. Number of Addenda: 0

## 2018-10-17 NOTE — Interval H&P Note (Signed)
History and Physical Interval Note: 47/female with change in bowel habits, rectal pain, obesity BMI 54.41 for her first colonoscopy.  10/17/2018 8:41 AM  Abigail Roberts  has presented today for colonoscopy with the diagnosis of Change in bowel habits, Rectal pain  The various methods of treatment have been discussed with the patient and family. After consideration of risks, benefits and other options for treatment, the patient has consented to  Procedure(s): COLONOSCOPY (N/A) as a surgical intervention .  The patient's history has been reviewed, patient examined, no change in status, stable for surgery.  I have reviewed the patient's chart and labs.  Questions were answered to the patient's satisfaction.     Ronnette Juniper

## 2018-10-19 ENCOUNTER — Encounter (HOSPITAL_COMMUNITY): Payer: Self-pay | Admitting: Gastroenterology

## 2018-11-29 DIAGNOSIS — R0683 Snoring: Secondary | ICD-10-CM | POA: Diagnosis not present

## 2018-11-29 DIAGNOSIS — R0681 Apnea, not elsewhere classified: Secondary | ICD-10-CM | POA: Diagnosis not present
